# Patient Record
Sex: Female | Born: 1980 | Hispanic: No | Smoking: Never smoker
Health system: Southern US, Community
[De-identification: ages and names within clinical notes are randomized; demographics above are authoritative.]

## PROBLEM LIST (undated history)

## (undated) DIAGNOSIS — J45909 Unspecified asthma, uncomplicated: Secondary | ICD-10-CM

## (undated) DIAGNOSIS — Z9109 Other allergy status, other than to drugs and biological substances: Secondary | ICD-10-CM

## (undated) DIAGNOSIS — Z91018 Allergy to other foods: Secondary | ICD-10-CM

## (undated) HISTORY — DX: Allergy to other foods: Z91.018

## (undated) HISTORY — PX: NASAL TURBINATE REDUCTION: SHX2072

## (undated) HISTORY — DX: Other allergy status, other than to drugs and biological substances: Z91.09

---

## 2006-10-25 ENCOUNTER — Other Ambulatory Visit: Admission: RE | Admit: 2006-10-25 | Discharge: 2006-10-25 | Payer: Self-pay | Admitting: Obstetrics and Gynecology

## 2007-11-06 ENCOUNTER — Other Ambulatory Visit: Admission: RE | Admit: 2007-11-06 | Discharge: 2007-11-06 | Payer: Self-pay | Admitting: Obstetrics and Gynecology

## 2008-11-27 ENCOUNTER — Other Ambulatory Visit: Admission: RE | Admit: 2008-11-27 | Discharge: 2008-11-27 | Payer: Self-pay | Admitting: Obstetrics and Gynecology

## 2010-12-07 ENCOUNTER — Other Ambulatory Visit (HOSPITAL_COMMUNITY)
Admission: RE | Admit: 2010-12-07 | Discharge: 2010-12-07 | Disposition: A | Discharge status: Home / Self Care | Payer: BC Managed Care – PPO | Source: Ambulatory Visit | Attending: Obstetrics and Gynecology | Admitting: Obstetrics and Gynecology

## 2010-12-07 DIAGNOSIS — Z01419 Encounter for gynecological examination (general) (routine) without abnormal findings: Secondary | ICD-10-CM | POA: Insufficient documentation

## 2010-12-07 DIAGNOSIS — Z113 Encounter for screening for infections with a predominantly sexual mode of transmission: Secondary | ICD-10-CM | POA: Insufficient documentation

## 2012-01-24 ENCOUNTER — Other Ambulatory Visit (HOSPITAL_COMMUNITY)
Admission: RE | Admit: 2012-01-24 | Discharge: 2012-01-24 | Disposition: A | Discharge status: Home / Self Care | Payer: BC Managed Care – PPO | Source: Ambulatory Visit | Attending: Obstetrics and Gynecology | Admitting: Obstetrics and Gynecology

## 2012-01-24 DIAGNOSIS — Z01419 Encounter for gynecological examination (general) (routine) without abnormal findings: Secondary | ICD-10-CM | POA: Insufficient documentation

## 2012-01-24 DIAGNOSIS — N760 Acute vaginitis: Secondary | ICD-10-CM | POA: Insufficient documentation

## 2014-01-02 ENCOUNTER — Ambulatory Visit (INDEPENDENT_AMBULATORY_CARE_PROVIDER_SITE_OTHER): Payer: Managed Care, Other (non HMO) | Admitting: Sports Medicine

## 2014-01-02 ENCOUNTER — Encounter: Payer: Self-pay | Admitting: Sports Medicine

## 2014-01-02 VITALS — BP 127/68 | Ht 64.0 in | Wt 145.0 lb

## 2014-01-02 DIAGNOSIS — M76829 Posterior tibial tendinitis, unspecified leg: Secondary | ICD-10-CM | POA: Insufficient documentation

## 2014-01-02 NOTE — Progress Notes (Signed)
Pt presents for 3 years of shin pain. 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 Since then she has had a lot of shin pain when she runs. She has a typical yearly cycle where she takes a break from running during the winter. She is typically pain free for the first 3-4 weeks and then starts having trouble with shin pain. The pain is bilaterally and sharp to throbbing when she overdoes it. It is located in specific areas of the medial shin that change over time. The pain is worst in the 1st mile of a given run and then subsides. She runs about 2x/week about 3-5 miles. She is trying to increase this for a half marathon coming up in mid-October. She has intermittent pain at rest. She also endorses occasional midfoot numbness and toe contractures after running.  She has tried many things over the years including foam rolls, stretches, compression, tape, ice, elevation, PT, shoes, orthotics, changing foot strike, stride length and speed. Nothing has helped significantly.  Examination  On exam she has localized tenderness to deep palpation of the medial shin bilaterally. She has no bony tenderness. Barefoot, she has pronation of the foot R>L which is also evident when running but is mostly corrected with her current orthotics. Heel raise shows norm post tib function Strength is norm Leg length equal Running form shows a neutral midfoot strike except mild pronation on RT even with arch support  On ultrasound there is significant fluid collection around the posterior tibilialis tendons R>L. There is no stress fracture visualized.  No significant periosteal swelling.  Flex Dig adn Flex Hallucis are OK

## 2014-01-02 NOTE — Assessment & Plan Note (Addendum)
Bilateral, chronic,  possible deep posterior compartment syndrome or periostitis but with tendon swelling visualized we will take that as the primary Dx and see if she responds to Tx - gave sports insoles with scaphoid pads - continue compression socks - posterior tib stretching and strengthening exercises - NSAIDs scheduled for 10 days then prn - f/u in 4-6 weeks

## 2014-01-09 ENCOUNTER — Ambulatory Visit: Payer: BC Managed Care – PPO | Admitting: Sports Medicine

## 2014-01-22 ENCOUNTER — Other Ambulatory Visit: Payer: Self-pay | Admitting: *Deleted

## 2014-01-22 DIAGNOSIS — M76829 Posterior tibial tendinitis, unspecified leg: Secondary | ICD-10-CM

## 2014-02-25 ENCOUNTER — Other Ambulatory Visit: Payer: Self-pay | Admitting: Nurse Practitioner

## 2014-02-25 ENCOUNTER — Other Ambulatory Visit (HOSPITAL_COMMUNITY)
Admission: RE | Admit: 2014-02-25 | Discharge: 2014-02-25 | Disposition: A | Discharge status: Home / Self Care | Payer: Managed Care, Other (non HMO) | Source: Ambulatory Visit | Attending: Nurse Practitioner | Admitting: Nurse Practitioner

## 2014-02-25 DIAGNOSIS — Z1151 Encounter for screening for human papillomavirus (HPV): Secondary | ICD-10-CM | POA: Diagnosis present

## 2014-02-25 DIAGNOSIS — Z01419 Encounter for gynecological examination (general) (routine) without abnormal findings: Secondary | ICD-10-CM | POA: Insufficient documentation

## 2014-02-26 LAB — CYTOLOGY - PAP

## 2015-04-26 NOTE — L&D Delivery Note (Addendum)
SVD of viable female @ 0301 APGARS 7-9, NICU called in to attend delivery,  presentation LOA with compound presentation of L arm, over intact perineum with epidural anesthesia.Terminal meconium. Spontaneous delivery of intact placenta with 3VC. Third degree laceration. Dr Alesia Richards called in to do repair. EBL Girl- Dr Margie Billet   I was called for repair of 3rd degree laceration and right labia majora laceration.  I Used 3-0 vicryl for repair of lacerations in usual fashion.  4 cc local aneshesia used over labial tear.  Rectal exam done revealed intact rectal mucosa and posterior vaginal wall.  Postpartum care reviewed with patient.   Dr. Alesia Richards.

## 2015-12-31 ENCOUNTER — Inpatient Hospital Stay (HOSPITAL_COMMUNITY): Payer: Managed Care, Other (non HMO) | Admitting: Anesthesiology

## 2015-12-31 ENCOUNTER — Inpatient Hospital Stay (HOSPITAL_COMMUNITY)
Admission: AD | Admit: 2015-12-31 | Discharge: 2016-01-03 | DRG: 775 | Disposition: A | Discharge status: Home / Self Care | Payer: Managed Care, Other (non HMO) | Source: Ambulatory Visit | Attending: Obstetrics & Gynecology | Admitting: Obstetrics & Gynecology

## 2015-12-31 ENCOUNTER — Encounter (HOSPITAL_COMMUNITY): Payer: Self-pay | Admitting: *Deleted

## 2015-12-31 DIAGNOSIS — O326XX0 Maternal care for compound presentation, not applicable or unspecified: Secondary | ICD-10-CM | POA: Diagnosis present

## 2015-12-31 DIAGNOSIS — Z3A38 38 weeks gestation of pregnancy: Secondary | ICD-10-CM | POA: Diagnosis not present

## 2015-12-31 DIAGNOSIS — J45909 Unspecified asthma, uncomplicated: Secondary | ICD-10-CM | POA: Diagnosis present

## 2015-12-31 DIAGNOSIS — Z3403 Encounter for supervision of normal first pregnancy, third trimester: Secondary | ICD-10-CM | POA: Diagnosis present

## 2015-12-31 DIAGNOSIS — O9952 Diseases of the respiratory system complicating childbirth: Secondary | ICD-10-CM | POA: Diagnosis present

## 2015-12-31 HISTORY — DX: Unspecified asthma, uncomplicated: J45.909

## 2015-12-31 LAB — TYPE AND SCREEN
ABO/RH(D): A POS
ANTIBODY SCREEN: NEGATIVE

## 2015-12-31 LAB — CBC
HCT: 38.6 % (ref 36.0–46.0)
Hemoglobin: 13.7 g/dL (ref 12.0–15.0)
MCH: 33.3 pg (ref 26.0–34.0)
MCHC: 35.5 g/dL (ref 30.0–36.0)
MCV: 93.9 fL (ref 78.0–100.0)
PLATELETS: 223 10*3/uL (ref 150–400)
RBC: 4.11 MIL/uL (ref 3.87–5.11)
RDW: 13.2 % (ref 11.5–15.5)
WBC: 12.7 10*3/uL — AB (ref 4.0–10.5)

## 2015-12-31 LAB — OB RESULTS CONSOLE ANTIBODY SCREEN: Antibody Screen: NEGATIVE

## 2015-12-31 LAB — ABO/RH: ABO/RH(D): A POS

## 2015-12-31 LAB — OB RESULTS CONSOLE RPR: RPR: NONREACTIVE

## 2015-12-31 LAB — OB RESULTS CONSOLE RUBELLA ANTIBODY, IGM: Rubella: IMMUNE

## 2015-12-31 LAB — OB RESULTS CONSOLE GBS: GBS: NEGATIVE

## 2015-12-31 LAB — OB RESULTS CONSOLE ABO/RH: RH TYPE: POSITIVE

## 2015-12-31 LAB — OB RESULTS CONSOLE HEPATITIS B SURFACE ANTIGEN: HEP B S AG: NEGATIVE

## 2015-12-31 LAB — OB RESULTS CONSOLE GC/CHLAMYDIA
Chlamydia: NEGATIVE
Gonorrhea: NEGATIVE

## 2015-12-31 MED ORDER — LIDOCAINE HCL (PF) 1 % IJ SOLN
INTRAMUSCULAR | Status: DC | PRN
  Administered 2015-12-31 (×2): 7 mL via EPIDURAL

## 2015-12-31 MED ORDER — PHENYLEPHRINE 40 MCG/ML (10ML) SYRINGE FOR IV PUSH (FOR BLOOD PRESSURE SUPPORT)
80.0000 ug | PREFILLED_SYRINGE | INTRAVENOUS | Status: DC | PRN
  Filled 2015-12-31: qty 5
  Filled 2015-12-31: qty 10

## 2015-12-31 MED ORDER — PHENYLEPHRINE 40 MCG/ML (10ML) SYRINGE FOR IV PUSH (FOR BLOOD PRESSURE SUPPORT)
80.0000 ug | PREFILLED_SYRINGE | INTRAVENOUS | Status: DC | PRN
  Filled 2015-12-31: qty 5

## 2015-12-31 MED ORDER — ALBUTEROL SULFATE (2.5 MG/3ML) 0.083% IN NEBU: 2.5000 mg | INHALATION_SOLUTION | Freq: Every day | RESPIRATORY_TRACT | Status: DC | PRN

## 2015-12-31 MED ORDER — EPHEDRINE 5 MG/ML INJ
10.0000 mg | INTRAVENOUS | Status: DC | PRN
  Filled 2015-12-31: qty 4

## 2015-12-31 MED ORDER — DIPHENHYDRAMINE HCL 50 MG/ML IJ SOLN: 12.5000 mg | INTRAMUSCULAR | Status: DC | PRN

## 2015-12-31 MED ORDER — ACETAMINOPHEN 325 MG PO TABS
650.0000 mg | ORAL_TABLET | ORAL | Status: DC | PRN
  Filled 2015-12-31 (×3): qty 2

## 2015-12-31 MED ORDER — OXYTOCIN BOLUS FROM INFUSION
500.0000 mL | Freq: Once | INTRAVENOUS | Status: AC
  Administered 2016-01-01: 500 mL via INTRAVENOUS

## 2015-12-31 MED ORDER — BUTORPHANOL TARTRATE 1 MG/ML IJ SOLN: 1.0000 mg | INTRAMUSCULAR | Status: DC | PRN

## 2015-12-31 MED ORDER — ONDANSETRON HCL 4 MG/2ML IJ SOLN: 4.0000 mg | Freq: Four times a day (QID) | INTRAMUSCULAR | Status: DC | PRN

## 2015-12-31 MED ORDER — FENTANYL 2.5 MCG/ML BUPIVACAINE 1/10 % EPIDURAL INFUSION (WH - ANES)
14.0000 mL/h | INTRAMUSCULAR | Status: DC | PRN
  Administered 2015-12-31 – 2016-01-01 (×2): 14 mL/h via EPIDURAL
  Filled 2015-12-31 (×2): qty 125

## 2015-12-31 MED ORDER — SALINE SPRAY 0.65 % NA SOLN
1.0000 | NASAL | Status: DC | PRN
  Filled 2015-12-31: qty 44

## 2015-12-31 MED ORDER — ALBUTEROL SULFATE (2.5 MG/3ML) 0.083% IN NEBU: 3.0000 mL | INHALATION_SOLUTION | Freq: Four times a day (QID) | RESPIRATORY_TRACT | Status: DC | PRN

## 2015-12-31 MED ORDER — OXYTOCIN 40 UNITS IN LACTATED RINGERS INFUSION - SIMPLE MED
2.5000 [IU]/h | INTRAVENOUS | Status: DC
  Filled 2015-12-31: qty 1000

## 2015-12-31 MED ORDER — LACTATED RINGERS IV SOLN
INTRAVENOUS | Status: DC
  Administered 2015-12-31: 20:00:00 via INTRAVENOUS

## 2015-12-31 MED ORDER — LACTATED RINGERS IV SOLN: 500.0000 mL | INTRAVENOUS | Status: DC | PRN

## 2015-12-31 MED ORDER — LACTATED RINGERS IV SOLN: 500.0000 mL | Freq: Once | INTRAVENOUS | Status: DC

## 2015-12-31 MED ORDER — SOD CITRATE-CITRIC ACID 500-334 MG/5ML PO SOLN: 30.0000 mL | ORAL | Status: DC | PRN

## 2015-12-31 MED ORDER — LIDOCAINE HCL (PF) 1 % IJ SOLN
30.0000 mL | INTRAMUSCULAR | Status: DC | PRN
  Filled 2015-12-31: qty 30

## 2015-12-31 NOTE — Progress Notes (Signed)
Kristi Richards is a 35 y.o. G1P0 at 47w0dadmitted for onset of labor  Subjective:  Comfortable with  Epidural Contractions every 2 minutes,   Objective: BP 99/63   Pulse (!) 101   Temp 97.7 F (36.5 C) (Oral)   Resp 18   Ht 5\' 4"  (1.626 m)   Wt 180 lb (81.6 kg)   SpO2 99%   BMI 30.90 kg/m  No intake/output data recorded. No intake/output data recorded.  FHT:  [id number] SVE:   Dilation: 7.5 Effacement (%): 90 Station: -1 Exam by:: [employer] CNM   Labs: Lab Results  Component Value Date   WBC 12.7 (H) 12/31/2015   HGB 13.7 12/31/2015   HCT 38.6 12/31/2015   MCV 93.9 12/31/2015   PLT 223 12/31/2015    Assessment / Plan: Spontaneous labor, progressing normally Fetal Wellbeing: reassuring Anticipated MOD:  NSVD  Lori A Clemmons 12/31/2015, 8:27 PM

## 2015-12-31 NOTE — Anesthesia Preprocedure Evaluation (Signed)
Anesthesia Evaluation  Patient identified by MRN, date of birth, ID band Patient awake    Reviewed: Allergy & Precautions, H&P , NPO status , Patient's Chart, lab work & pertinent test results  Airway Mallampati: I  TM Distance: >3 FB Neck ROM: full    Dental no notable dental hx.    Pulmonary    Pulmonary exam normal        Cardiovascular negative cardio ROS Normal cardiovascular exam     Neuro/Psych negative neurological ROS  negative psych ROS   GI/Hepatic negative GI ROS, Neg liver ROS,   Endo/Other  negative endocrine ROS  Renal/GU negative Renal ROS     Musculoskeletal   Abdominal (+) + obese,   Peds  Hematology negative hematology ROS (+)   Anesthesia Other Findings   Reproductive/Obstetrics (+) Pregnancy                             Anesthesia Physical Anesthesia Plan  ASA: II  Anesthesia Plan: Epidural   Post-op Pain Management:    Induction:   Airway Management Planned:   Additional Equipment:   Intra-op Plan:   Post-operative Plan:   Informed Consent: I have reviewed the patients History and Physical, chart, labs and discussed the procedure including the risks, benefits and alternatives for the proposed anesthesia with the patient or authorized representative who has indicated his/her understanding and acceptance.     Plan Discussed with:   Anesthesia Plan Comments:         Anesthesia Quick Evaluation

## 2015-12-31 NOTE — MAU Note (Signed)
Notified provider that patient is 5.5/80/-2. Provider said she would puty in admit orders.

## 2015-12-31 NOTE — H&P (Signed)
Kristi Richards is a 35 y.o. female, G1P0 at [redacted]w[redacted]d, presenting for painful contractions every 2-63min.  That starting around noon and got progressively closer together and stronger.  Now, resting comfortably with epidural  Preg c/b:  1) Asthma- albuterol prn, Symbicort usually once per week- not seeing anyone for her asthma 2) RUQ pain in first trimester, possible hemangioma on [place], plan for repeat postpartum.  Currently asymptomatic   OB History    Gravida Para Term Preterm AB Living   1             SAB TAB Ectopic Multiple Live Births                 No past medical history on file. No past surgical history on file. Family History: family history is not on file. Social History:  reports that she has never smoked. She does not have any smokeless tobacco history on file. Her alcohol and drug histories are not on file.   Prenatal Transfer Tool  Maternal Diabetes: No Genetic Screening: Normal Maternal Ultrasounds/Referrals: Normal Fetal Ultrasounds or other Referrals:  None Maternal Substance Abuse:  No Significant Maternal Medications:  Meds include: Other: Proair, symbicort Significant Maternal Lab Results: Lab values include: Group B Strep negative  ROS:  No headache, no blurry vision, no RUQ apin  Allergies  Allergen Reactions  . Codeine     Gen: NAD Chest clear Heart RRR Abd gravid, NT Ext: minimal edema, no calf tenderness bilaterally  FHT: 145, moderate variability, no accels, no decels Toco: q 2-23min. SVE: AROM clear, 7/90/-1  Prenatal labs: ABO, Rh: A positive   Antibody:  negative Rubella:  immune RPR:   NR HBsAg:   neg HIV:   neg GBS:  neg Genetic screenings:  negative Glucola:  107 Hgb: 11.9       Assessment/Plan: 35yo G1P0@ [redacted]w[redacted]d in active labor -FWB: Cat. I -Labor: expectant management -Pain: continue epidural -Asthma: nebulizer prn  CCOB to cover from 7p-7am  Janyth Pupa, DO [phone] (pager) [phone] (office)

## 2015-12-31 NOTE — Anesthesia Procedure Notes (Signed)
Epidural Patient location during procedure: OB Start time: 12/31/2015 5:55 PM End time: 12/31/2015 5:59 PM  Staffing Anesthesiologist: Lyn Hollingshead Performed: anesthesiologist   Preanesthetic Checklist Completed: patient identified, surgical consent, pre-op evaluation, timeout performed, IV checked, risks and benefits discussed and monitors and equipment checked  Epidural Patient position: sitting Prep: site prepped and draped and DuraPrep Patient monitoring: continuous pulse ox and blood pressure Approach: midline Location: L3-L4 Injection technique: LOR air  Needle:  Needle type: Tuohy  Needle gauge: 17 G Needle length: 9 cm and 9 Needle insertion depth: 5 cm cm Catheter type: closed end flexible Catheter size: 19 Gauge Catheter at skin depth: 10 cm Test dose: negative and Other  Assessment Sensory level: T9 Events: blood not aspirated, injection not painful, no injection resistance, negative IV test and no paresthesia  Additional Notes Reason for block:procedure for pain

## 2015-12-31 NOTE — MAU Note (Signed)
Pt reports having strong ctx off and on  All afternoon. Denies any vag bleeding or leaking. Good fetal movement reported

## 2015-12-31 NOTE — Anesthesia Pain Management Evaluation Note (Signed)
  CRNA Pain Management Visit Note  Patient: Kristi Richards, 36 y.o., female  "Hello I am a member of the anesthesia team at [facility]. We have an anesthesia team available at all times to provide care throughout the hospital, including epidural management and anesthesia for C-section. I don't know your plan for the delivery whether it a natural birth, water birth, IV sedation, nitrous supplementation, doula or epidural, but we want to meet your pain goals."   1.Was your pain managed to your expectations on prior hospitalizations?   No prior hospitalizationsNo prior hospitalizations  2.What is your expectation for pain management during this hospitalization?     Epidural  3.How can we help you reach that goal? Epidural has been places and is working well.  Record the patient's initial score and the patient's pain goal.   Pain: 2  Pain Goal: 4 [facility] wants you to be able to say your pain was always managed very well.  Kristi Richards 12/31/2015

## 2016-01-01 ENCOUNTER — Encounter (HOSPITAL_COMMUNITY): Payer: Self-pay | Admitting: *Deleted

## 2016-01-01 LAB — RPR: RPR: NONREACTIVE

## 2016-01-01 MED ORDER — ONDANSETRON HCL 4 MG PO TABS
4.0000 mg | ORAL_TABLET | ORAL | Status: DC | PRN
Start: 2016-01-01 — End: 2016-01-03

## 2016-01-01 MED ORDER — ACETAMINOPHEN 325 MG PO TABS
650.0000 mg | ORAL_TABLET | ORAL | Status: DC | PRN
Start: 2016-01-01 — End: 2016-01-03
  Administered 2016-01-01 – 2016-01-02 (×3): 650 mg via ORAL
  Filled 2016-01-01: qty 2

## 2016-01-01 MED ORDER — SENNOSIDES-DOCUSATE SODIUM 8.6-50 MG PO TABS
2.0000 | ORAL_TABLET | ORAL | Status: DC
  Administered 2016-01-02 – 2016-01-03 (×3): 2 via ORAL
  Filled 2016-01-01 (×3): qty 2

## 2016-01-01 MED ORDER — ZOLPIDEM TARTRATE 5 MG PO TABS: 5.0000 mg | ORAL_TABLET | Freq: Every evening | ORAL | Status: DC | PRN

## 2016-01-01 MED ORDER — PRENATAL MULTIVITAMIN CH
1.0000 | ORAL_TABLET | Freq: Every day | ORAL | Status: DC
  Administered 2016-01-01 – 2016-01-03 (×3): 1 via ORAL
  Filled 2016-01-01 (×3): qty 1

## 2016-01-01 MED ORDER — TETANUS-DIPHTH-ACELL PERTUSSIS 5-2.5-18.5 LF-MCG/0.5 IM SUSP: 0.5000 mL | Freq: Once | INTRAMUSCULAR | Status: DC

## 2016-01-01 MED ORDER — DIBUCAINE 1 % RE OINT
1.0000 | TOPICAL_OINTMENT | RECTAL | Status: DC | PRN
Start: 2016-01-01 — End: 2016-01-03

## 2016-01-01 MED ORDER — BENZOCAINE-MENTHOL 20-0.5 % EX AERO
1.0000 "application " | INHALATION_SPRAY | CUTANEOUS | Status: DC | PRN
  Administered 2016-01-02: 1 via TOPICAL
  Filled 2016-01-01: qty 56

## 2016-01-01 MED ORDER — SIMETHICONE 80 MG PO CHEW: 80.0000 mg | CHEWABLE_TABLET | ORAL | Status: DC | PRN

## 2016-01-01 MED ORDER — WITCH HAZEL-GLYCERIN EX PADS: 1.0000 "application " | MEDICATED_PAD | CUTANEOUS | Status: DC | PRN

## 2016-01-01 MED ORDER — ONDANSETRON HCL 4 MG/2ML IJ SOLN: 4.0000 mg | INTRAMUSCULAR | Status: DC | PRN

## 2016-01-01 MED ORDER — COCONUT OIL OIL: 1.0000 "application " | TOPICAL_OIL | Status: DC | PRN

## 2016-01-01 MED ORDER — DIPHENHYDRAMINE HCL 25 MG PO CAPS: 25.0000 mg | ORAL_CAPSULE | Freq: Four times a day (QID) | ORAL | Status: DC | PRN

## 2016-01-01 MED ORDER — IBUPROFEN 600 MG PO TABS
600.0000 mg | ORAL_TABLET | Freq: Four times a day (QID) | ORAL | Status: DC
  Administered 2016-01-01 – 2016-01-03 (×6): 600 mg via ORAL
  Filled 2016-01-01 (×11): qty 1

## 2016-01-01 NOTE — Anesthesia Postprocedure Evaluation (Signed)
Anesthesia Post Note  Patient: Kristi Richards  Procedure(s) Performed: * No procedures listed *  Patient location during evaluation: Mother Baby Anesthesia Type: Epidural Level of consciousness: awake Pain management: pain level controlled Vital Signs Assessment: post-procedure vital signs reviewed and stable Respiratory status: spontaneous breathing Cardiovascular status: stable Postop Assessment: no headache, no backache, epidural receding and patient able to bend at knees Anesthetic complications: no     Last Vitals:  Vitals:   01/01/16 0559 01/01/16 0701  BP: 117/64 122/61  Pulse: 68 71  Resp: 20   Temp: 36.7 C 36.8 C    Last Pain:  Vitals:   01/01/16 0559  TempSrc: Oral  PainSc: 0-No pain   Pain Goal:                 [name]

## 2016-01-01 NOTE — Lactation Note (Signed)
This note was copied from a baby's chart. Lactation Consultation Note  P1, Baby 36 hours old.  Reviewed hand expression.  Easily expressed flow. Evert, compressible nipples. Assisted w/ latching in cradle hold.  Intermittent sucks and swallows observed w/ compression Sandria Senter. Reviewed basics and Baby and Me booklet.  Mom encouraged to feed baby 8-12 times/24 hours and with feeding cues.  Mom made aware of O/P services, breastfeeding support groups, community resources, and our phone # for post-discharge questions.    Patient Name: Kristi Richards [id number] Date: 01/01/2016 Reason for consult: Initial assessment   Maternal Data Has patient been taught Hand Expression?: Yes Does the patient have breastfeeding experience prior to this delivery?: No  Feeding Feeding Type: Breast Fed  LATCH Score/Interventions Latch: Grasps breast easily, tongue down, lips flanged, rhythmical sucking. Intervention(s): Adjust position;Assist with latch;Breast massage  Audible Swallowing: A few with stimulation  Type of Nipple: Everted at rest and after stimulation  Comfort (Breast/Nipple): Soft / non-tender     Hold (Positioning): Assistance needed to correctly position infant at breast and maintain latch.  LATCH Score: 8  Lactation Tools Discussed/Used     Consult Status Consult Status: Follow-up Date: 01/02/16 Follow-up type: In-patient    Vivianne Master [facility] 01/01/2016, 2:53 PM

## 2016-01-01 NOTE — Progress Notes (Signed)
Postpartum day #0, NSVD, 3rd degree laceration  Subjective Pt without complaints.  Lochia normal.  Pain controlled.  Breast feeding yes  Temp:  [97.7 F (36.5 C)-99.1 F (37.3 C)] 97.9 F (36.6 C) (09/08 1215) Pulse Rate:  [62-119] 62 (09/08 1215) Resp:  [16-20] 20 (09/08 1215) BP: (99-170)/(50-123) 119/55 (09/08 1215) SpO2:  [99 %-100 %] 99 % (09/07 2058) Weight:  [81.6 kg (180 lb)] 81.6 kg (180 lb) (09/07 1721)  Gen:  NAD, A&O x 3 Uterine fundus:  Firm, nontender Lochia normal   CBC    Component Value Date/Time   WBC 12.7 (H) 12/31/2015 1658   RBC 4.11 12/31/2015 1658   HGB 13.7 12/31/2015 1658   HCT 38.6 12/31/2015 1658   PLT 223 12/31/2015 1658   MCV 93.9 12/31/2015 1658   MCH 33.3 12/31/2015 1658   MCHC 35.5 12/31/2015 1658   RDW 13.2 12/31/2015 1658     A/P: S/p SVD doing well. Routine postpartum care. Lactation support. 3rd degree laceration.  F/u 2 weeks postpartum. [facility] to assume care at 7 pm and the remainder of the weekend.  Pt informed.  Kristi Richards 01/01/2016, 1:17 PM

## 2016-01-02 LAB — CBC
HCT: 33.4 % — ABNORMAL LOW (ref 36.0–46.0)
Hemoglobin: 11.5 g/dL — ABNORMAL LOW (ref 12.0–15.0)
MCH: 33 pg (ref 26.0–34.0)
MCHC: 34.4 g/dL (ref 30.0–36.0)
MCV: 95.7 fL (ref 78.0–100.0)
Platelets: 192 K/uL (ref 150–400)
RBC: 3.49 MIL/uL — ABNORMAL LOW (ref 3.87–5.11)
RDW: 13.8 % (ref 11.5–15.5)
WBC: 12.9 K/uL — ABNORMAL HIGH (ref 4.0–10.5)

## 2016-01-02 NOTE — Discharge Instructions (Signed)

## 2016-01-02 NOTE — Discharge Summary (Signed)
[facility] Discharge Summary   Patient Name:   Kristi Richards DOB:     [birth year] MRN:     [id number]  Date of Admission:   12/31/2015 Date of Discharge:  01/03/2016  Admitting diagnosis:    38WKS LABOR EVAL Principal Problem:   Vaginal delivery Active Problems:   Third degree perineal laceration  3rd degree laceration    Discharge diagnosis:    38WKS LABOR EVAL Principal Problem:   Vaginal delivery Active Problems:   Third degree perineal laceration  3rd degree laceration                                                                Post partum procedures: NA  Type of Delivery:  SVB   Delivering Provider: Larey Days  for [facility], 3rd degree repair by Dr. Alesia Richards  Date of Delivery:  01/01/16  Newborn Data:    Live born female  Birth Weight: 6 lb 14.8 oz (3140 g) APGAR: 7, 9    Baby Feeding:   Breast Disposition:   Home with mother  Complications:   None  Hospital course:      Onset of Labor With Vaginal Delivery     35 y.o. yo G1P1001 at [redacted]w[redacted]d was admitted in Active Labor on 12/31/2015. Patient had an uncomplicated labor course as follows:  Membrane Rupture Time/Date: 6:57 PM ,12/31/2015   Intrapartum Procedures: Episiotomy: None [1]                                         Lacerations:  3rd degree [4];Labial [10]  Patient had a delivery of a Viable infant. 01/01/2016  Information for the patient's newborn:  Tyquasha, Sudar [id number]  Delivery Method: Vag-Spont    Pateint had an uncomplicated postpartum course.  She is ambulating, tolerating a regular diet, passing flatus, and urinating well. Patient is discharged home in stable condition on 01/03/16. She is to f/u in 2 weeks at [facility] for recheck of perineal laceration healing.    Physical Exam:   Vitals:   01/01/16 1215 01/01/16 1730 01/01/16 2105 01/02/16 0535  BP: (!) 119/55 (!) 109/57 115/63 116/64  Pulse: 62 67 72 60  Resp: 20 18  18   Temp: 97.9 F (36.6 C) 98.3 F (36.8 C) 98 F (36.7  C) 98.1 F (36.7 C)  TempSrc: Oral Oral Oral Oral  SpO2:   98%   Weight:      Height:       General: alert Lochia: appropriate Uterine Fundus: firm Incision: Healing well with no significant drainage DVT Evaluation: No evidence of DVT seen on physical exam. Negative Homan's sign.  Labs:  CBC Latest Ref Rng & Units 01/02/2016 12/31/2015  WBC 4.0 - 10.5 K/uL 12.9(H) 12.7(H)  Hemoglobin 12.0 - 15.0 g/dL 11.5(L) 13.7  Hematocrit 36.0 - 46.0 % 33.4(L) 38.6  Platelets 150 - 400 K/uL 192 223     Discharge instruction: per After Visit Summary and "Baby and Me Booklet".  After Visit Meds:    Medication List    TAKE these medications   budesonide-formoterol 160-4.5 MCG/ACT inhaler Commonly known as:  SYMBICORT Inhale 1 puff into the lungs 2 (two) times  daily.   calcium carbonate 500 MG chewable tablet Commonly known as:  TUMS - dosed in mg elemental calcium Chew 1 tablet by mouth daily.   cetirizine 10 MG tablet Commonly known as:  ZYRTEC Take 10 mg by mouth daily.   ibuprofen 600 MG tablet Commonly known as:  ADVIL,MOTRIN Take 1 tablet (600 mg total) by mouth every 6 (six) hours as needed.   montelukast 10 MG tablet Commonly known as:  SINGULAIR   prenatal multivitamin Tabs tablet Take 1 tablet by mouth daily at 12 noon.   PROAIR HFA 108 (90 Base) MCG/ACT inhaler Generic drug:  albuterol       Diet: routine diet  Activity: Advance as tolerated. Pelvic rest for 6 weeks.   Outpatient follow up:2 weeks with Dr. Simona Huh for laceration evaluation Follow up Appt:No future appointments. Follow up visit: No Follow-up on file.  Postpartum contraception: Undecided  01/03/2016 Donnel Saxon, CNM

## 2016-01-02 NOTE — Lactation Note (Signed)
This note was copied from a baby's chart. Lactation Consultation Note  Patient Name: Kristi Richards [id number] Date: 01/02/2016 Reason for consult: Follow-up assessment  Baby 75 hours old. Mom hold baby against her chest, baby fussy and arching her back while crying. Mom reports that she thinks the baby is gassy. Offered to assist with latching baby at breast, but mom declined stating that she thinks the baby is nursing well. Enc mom to call for assistance with latching as needed.   Maternal Data    Feeding Feeding Type: Breast Fed Length of feed: 20 min  LATCH Score/Interventions                      Lactation Tools Discussed/Used     Consult Status Consult Status: Follow-up Date: 01/03/16 Follow-up type: In-patient    Andres Labrum 01/02/2016, 5:58 PM

## 2016-01-02 NOTE — Progress Notes (Signed)
Subjective: Postpartum Day 1: Vaginal delivery, 3rd degree laceration Patient up ad lib, reports no syncope or dizziness. Feeding:  Breast Contraceptive plan:  Undecided  Objective: Vital signs in last 24 hours: Temp:  [97.9 F (36.6 C)-98.3 F (36.8 C)] 98 F (36.7 C) (09/08 2105) Pulse Rate:  [62-72] 72 (09/08 2105) Resp:  [18-20] 18 (09/08 1730) BP: (109-122)/(55-64) 115/63 (09/08 2105) SpO2:  [98 %] 98 % (09/08 2105)  Physical Exam:  General: alert Lochia: appropriate Uterine Fundus: firm Perineum: healing well DVT Evaluation: No evidence of DVT seen on physical exam. Negative Homan's sign.   CBC Latest Ref Rng & Units 12/31/2015  WBC 4.0 - 10.5 K/uL 12.7(H)  Hemoglobin 12.0 - 15.0 g/dL 13.7  Hematocrit 36.0 - 46.0 % 38.6  Platelets 150 - 400 K/uL 223     Assessment/Plan: Status post vaginal delivery day 1. Stable Continue current care. Day 1 CBC pending Plan for discharge tomorrow    Allena Katz 01/02/2016, 5:30 AM

## 2016-01-03 MED ORDER — IBUPROFEN 600 MG PO TABS: 600.0000 mg | ORAL_TABLET | Freq: Four times a day (QID) | ORAL | 2 refills | Status: AC | PRN

## 2016-01-03 NOTE — Lactation Note (Signed)
This note was copied from a baby's chart. Lactation Consultation Note  Baby latched on L breast upon entering. Swallows observed with stimulation.  Baby sleepy. Suggest mother burp and switch baby to R breast.  Sucks and swallows observed. Reminded mother to compress breast during feedings to keep baby active. Recommend mother post pump w/ manual pump after feedings and give back to baby on spoon until voids/stools increase. Reviewed engorgement care and monitoring voids/stools. Mom encouraged to feed baby 8-12 times/24 hours and with feeding cues.    Patient Name: Kristi Richards [id number] Date: 01/03/2016 Reason for consult: Follow-up assessment   Maternal Data    Feeding Feeding Type: Breast Fed Length of feed: 20 min  LATCH Score/Interventions Latch: Grasps breast easily, tongue down, lips flanged, rhythmical sucking.  Audible Swallowing: A few with stimulation Intervention(s): Hand expression;Alternate breast massage;Skin to skin  Type of Nipple: Everted at rest and after stimulation  Comfort (Breast/Nipple): Soft / non-tender  Problem noted: Mild/Moderate discomfort Interventions (Mild/moderate discomfort): Hand expression  Hold (Positioning): No assistance needed to correctly position infant at breast.  LATCH Score: 9  Lactation Tools Discussed/Used     Consult Status Consult Status: Complete    Carlye Grippe 01/03/2016, 9:59 AM

## 2017-02-17 ENCOUNTER — Ambulatory Visit (INDEPENDENT_AMBULATORY_CARE_PROVIDER_SITE_OTHER): Payer: Managed Care, Other (non HMO) | Admitting: Allergy

## 2017-02-17 ENCOUNTER — Encounter: Payer: Self-pay | Admitting: Allergy

## 2017-02-17 VITALS — BP 102/60 | HR 90 | Temp 98.3°F | Resp 16 | Ht 64.5 in | Wt 157.0 lb

## 2017-02-17 DIAGNOSIS — J4530 Mild persistent asthma, uncomplicated: Secondary | ICD-10-CM | POA: Diagnosis not present

## 2017-02-17 DIAGNOSIS — T781XXD Other adverse food reactions, not elsewhere classified, subsequent encounter: Secondary | ICD-10-CM

## 2017-02-17 DIAGNOSIS — J309 Allergic rhinitis, unspecified: Secondary | ICD-10-CM

## 2017-02-17 DIAGNOSIS — Z91018 Allergy to other foods: Secondary | ICD-10-CM

## 2017-02-17 DIAGNOSIS — H1010 Acute atopic conjunctivitis, unspecified eye: Secondary | ICD-10-CM | POA: Diagnosis not present

## 2017-02-17 MED ORDER — EPINEPHRINE 0.3 MG/0.3ML IJ SOAJ: 0.3000 mg | Freq: Once | INTRAMUSCULAR | 1 refills | Status: AC

## 2017-02-17 MED ORDER — BECLOMETHASONE DIPROP HFA 80 MCG/ACT IN AERB: 2.0000 | INHALATION_SPRAY | Freq: Two times a day (BID) | RESPIRATORY_TRACT | 5 refills | Status: AC

## 2017-02-17 MED ORDER — ALBUTEROL SULFATE HFA 108 (90 BASE) MCG/ACT IN AERS: INHALATION_SPRAY | RESPIRATORY_TRACT | 1 refills | Status: AC

## 2017-02-17 NOTE — Progress Notes (Signed)
New Patient Note  RE: Kristi Richards MRN: [id number] DOB: 11/05/1980 Date of Office Visit: 02/17/2017  Referring provider: Kelton Pillar, MD Primary care provider: Kelton Pillar, MD  Chief Complaint: asthma, allergies, food allergy  History of present illness: Kristi Richards is a 36 y.o. female presenting today for consultation for asthma, allergies, pollen food allergy syndrome.   She was previously seen by Dr. Neldon Mc however her last visit was in 2012.    She had a baby last year and after this she has noticed that foods she was sensitive too has worsen and she feels she has developed new food sensitivities.  She states she has PFAS and reports being allergic to some raw fruits (mostly stone fruits) and raw vegeatbles.  She avoids bell peppers (mostly with green and red) as she developed itchy tongue, itchy throat and inner ear itch and will usually need to use her inhaler (not because she is having difficulty breathing but do to throat discomfort).  She had reaction to bell pepper this year in [place] that was made in a stew.  With stone fruits (peaches, cherries) as well as bananas she developed similar symptoms to bell pepper.  She can eat cooked and frozen forms of the fruits.  Also avoids raw carrots.  She made roasted carrots for her child this weekend however she ate a piece of raw carrot and similar reaction as to bell peppers.  She has noticed reaction to beers.  She reports it doesn't happen with all beers but mostly with IPAs.  She felt she like her throat was tightening.  She reports doing fine with darker beers.  She had a raw veggie salad with squash, zucchini, tomato, red onion and reports similar symptoms to bell pepper.     She is allergic eggs and she reports she vomits with ingestion.  She is able to eat baked egg options.  Her daughter has also been diagnosed with an egg allergy.   She has history of asthma diagnosed in childhood.  She feels her triggers are  mostly allergies.  She uses albuterol about twice week.  She reports symptoms of SOB with good relief of symptoms.  No nighttime awakenings.  She denies remembering any exacerbations requiring Ed/UC or oral steroids.  She is on symbicort 1 puffs a day and she reports using it about once a week.  She still takes singulair daily.    She has allergic rhinoconjunctivitis with symptoms of itchy eyes, nasal congestion.  She does not remember what she was allergic test on previous testing. She reports using saline spray occasionally.  She has used flonase in the past.  She has alrex eyedrops that she uses 2 times a week under direction of her eye doctor.  She also has Alaway that she uses as needed.  She reports she get very itchy and develop hives with contact exposure to certain dogs but not to her own.      Review of systems: Review of Systems  Constitutional: Negative for chills, fever and malaise/fatigue.  HENT: Positive for congestion. Negative for ear discharge, ear pain, nosebleeds, sinus pain, sore throat and tinnitus.   Eyes: Negative for discharge and redness.  Respiratory: Positive for shortness of breath. Negative for cough and wheezing.   Cardiovascular: Negative for chest pain.  Gastrointestinal: Negative for abdominal pain, constipation, diarrhea, heartburn, nausea and vomiting.  Musculoskeletal: Negative for joint pain and myalgias.  Skin: Negative for itching and rash.  Neurological: Negative for headaches.  All other systems negative unless noted above in HPI  Past medical history: Past Medical History:  Diagnosis Date  . Asthma    using inhaler daily   . Environmental allergies   . Food allergy     Past surgical history: Past Surgical History:  Procedure Laterality Date  . NASAL TURBINATE REDUCTION      Family history:  Family History  Problem Relation Age of Onset  . Food Allergy Daughter     Social history: She lives in a townhome without carpeting with  electric heating and central cooling.  There are 2 dogs in the home.  There is no concern for water damage, mildew or roaches in the home.  She works as a Facilities manager. She has no smoking history.     Medication List: Allergies as of 02/17/2017      Reactions   Codeine Nausea And Vomiting      Medication List       Accurate as of 02/17/17  9:51 PM. Always use your most recent med list.          albuterol 108 (90 Base) MCG/ACT inhaler Commonly known as:  PROAIR HFA 2 puff by inhalation every 4-6 hours as needed.   ALREX 0.2 % Susp Generic drug:  loteprednol   beclomethasone 80 MCG/ACT inhaler Commonly known as:  QVAR REDIHALER Inhale 2 puffs into the lungs 2 (two) times daily.   budesonide-formoterol 160-4.5 MCG/ACT inhaler Commonly known as:  SYMBICORT Inhale 1 puff into the lungs 2 (two) times daily.   calcium carbonate 500 MG chewable tablet Commonly known as:  TUMS - dosed in mg elemental calcium Chew 1 tablet by mouth daily.   CAMILA 0.35 MG tablet Generic drug:  norethindrone Take 1 tablet by mouth daily.   cetirizine 10 MG tablet Commonly known as:  ZYRTEC Take 10 mg by mouth daily.   EPINEPHrine 0.3 mg/0.3 mL Soaj injection Commonly known as:  AUVI-Q Inject 0.3 mLs (0.3 mg total) into the muscle once.   ibuprofen 600 MG tablet Commonly known as:  ADVIL,MOTRIN Take 1 tablet (600 mg total) by mouth every 6 (six) hours as needed.   montelukast 10 MG tablet Commonly known as:  SINGULAIR       Known medication allergies: Allergies  Allergen Reactions  . Codeine Nausea And Vomiting     Physical examination: Blood pressure 102/60, pulse 90, temperature 98.3 F (36.8 C), temperature source Oral, resp. rate 16, height 5' 4.5" (1.638 m), weight 157 lb (71.2 kg), SpO2 98 %, unknown if currently breastfeeding.  General: Alert, interactive, in no acute distress. HEENT: TMs pearly gray, turbinates mildly edematous with clear discharge, post-pharynx  non erythematous. Neck: Supple without lymphadenopathy. Lungs: Clear to auscultation without wheezing, rhonchi or rales. {no increased work of breathing. CV: Normal S1, S2 without murmurs. Abdomen: Nondistended, nontender. Skin: Warm and dry, without lesions or rashes. Extremities:  No clubbing, cyanosis or edema. Neuro:   Grossly intact.  Diagnositics/Labs: Spirometry: FEV1: 2.68L  83%, FVC: 3.75L  97%, ratio consistent with nonobstructive pattern  Assessment and plan:   Asthma, mild persistent    - at this time control is good    - stop symbicort and will step-down therapy to Qvar 2 puffs twice a day    - continue singulair 10mg  daily - take at bedtime    - have access to albuterol inhaler 2 puffs every 4-6 hours as needed for cough/wheeze/shortness of breath/chest tightness.  May use 15-20 minutes prior  to activity.   Monitor frequency of use.    Asthma control goals:   Full participation in all desired activities (may need albuterol before activity)  Albuterol use two time or less a week on average (not counting use with activity)  Cough interfering with sleep two time or less a month  Oral steroids no more than once a year  No hospitalizations  Allergic rhinoconjunctivitis    - will obtain environmental panel   - continue Zyrtec 10mg  daily   - use Flonase 1-2 sprays each nostril as needed for nasal congestion/drainage.  It is most effective if you use daily for 1-2 weeks at a time before stopping once symptoms improve   - singulair as above   - use your alrex as directed by your eye doctor   - use Alaway 1 drop each eye as needed for itchy/watery/red eyes  Food allergy    - continue avoidance of stove-top eggs    - will obtain egg IgE     - will prescribe you an AuviQ to have access to incase of allergic reaction.  Follow emergency action plan.    Pollen Food Allergy syndrome    - continue avoidance of current foods that cause oral symptoms including bell pepper  (green/red), stone fruits, carrots, banana, squash/zucchini, tomato, onion at this time.   Also would avoid IPA beers as much as possible.  Will obtain serum IgE to these foods to ensure you have not developed any sensitizations to these    - PFAS very rarely progresses outside of the oral cavity  She is a [employer] employee and request labs instead of prick testing today  Follow-up 4-6 months or sooner if needed  I appreciate the opportunity to take part in Sheldon's care. Please do not hesitate to contact me with questions.  Sincerely,   Prudy Feeler, MD Allergy/Immunology [facility] [place] [facility] [place]

## 2017-02-17 NOTE — Patient Instructions (Addendum)
Asthma    - at this time control is good    - stop symbicort and will step-down therapy to Qvar 2 puffs twice a day    - continue singulair 10mg  daily - take at bedtime    - have access to albuterol inhaler 2 puffs every 4-6 hours as needed for cough/wheeze/shortness of breath/chest tightness.  May use 15-20 minutes prior to activity.   Monitor frequency of use.    Asthma control goals:   Full participation in all desired activities (may need albuterol before activity)  Albuterol use two time or less a week on average (not counting use with activity)  Cough interfering with sleep two time or less a month  Oral steroids no more than once a year  No hospitalizations  Allergic rhinoconjunctivitis    - will obtain environmental panel   - continue Zyrtec 10mg  daily   - use Flonase 1-2 sprays each nostril as needed for nasal congestion/drainage.  It is most effective if you use daily for 1-2 weeks at a time before stopping once symptoms improve   - singulair as above   - use your alrex as directed by your eye doctor   - use Alaway 1 drop each eye as needed for itchy/watery/red eyes  Food allergy    - continue avoidance of stove-top eggs    - will obtain egg IgE     - will prescribe you an AuviQ to have access to incase of allergic reaction.  Follow emergency action plan.    Pollen Food Allergy syndrome    - continue avoidance of current foods that cause oral symptoms including bell pepper (green/red), stone fruits, carrots, banana, squash/zucchini, tomato, onion at this time.   Also would avoid IPA beers as much as possible.  Will obtain serum IgE to these foods to ensure you have not developed any sensitizations to these    - PFAS very rarely progresses outside of the oral cavity   Follow-up 4-6 months or sooner if needed

## 2017-02-20 LAB — IGE+ALLERGENS ZONE 2(30)
Amer Sycamore IgE Qn: 1.6 kU/L — AB
Aspergillus Fumigatus IgE: 0.52 kU/L — AB
Bahia Grass IgE: 4.13 kU/L — AB
Cat Dander IgE: 0.64 kU/L — AB
Cladosporium Herbarum IgE: 0.18 kU/L — AB
Cockroach, American IgE: 0.2 kU/L — AB
D Farinae IgE: 1.75 kU/L — AB
D Pteronyssinus IgE: 1.78 kU/L — AB
E005-IGE DOG DANDER: 3.22 kU/L — AB
Elm, American IgE: 2.23 kU/L — AB
G002-IGE BERMUDA GRASS: 1.92 kU/L — AB
G010-IGE JOHNSON GRASS: 2.54 kU/L — AB
Hickory, White IgE: 2.44 kU/L — AB
IgE (Immunoglobulin E), Serum: 148 IU/mL — ABNORMAL HIGH (ref 0–100)
M004-IGE MUCOR RACEMOSUS: 0.54 kU/L — AB
M006-IGE ALTERNARIA ALTERNATA: 0.37 kU/L — AB
M010-IGE STEMPHYLIUM HERBARUM: 0.62 kU/L — AB
MUGWORT IGE QN: 1.08 kU/L — AB
Maple/Box Elder IgE: 1.76 kU/L — AB
Nettle IgE: 0.79 kU/L — AB
Penicillium Chrysogen IgE: 0.17 kU/L — AB
Pigweed, Rough IgE: 1.13 kU/L — AB
Ragweed, Short IgE: 4.13 kU/L — AB
SWEET GUM IGE RAST QL: 1.35 kU/L — AB
Sheep Sorrel IgE Qn: 1.37 kU/L — AB
T003-IGE COMMON SILVER BIRCH: 5.3 kU/L — AB
T006-IGE CEDAR, MOUNTAIN: 0.9 kU/L — AB
T007-IGE OAK, WHITE: 5.46 kU/L — AB
Timothy Grass IgE: 5.44 kU/L — AB
W009-IGE PLANTAIN, ENGLISH: 1.77 kU/L — AB
White Mulberry IgE: 0.64 kU/L — AB

## 2017-02-20 LAB — ALLERGEN PEACH F95: ALLERGEN PEACH F95: 1.15 kU/L — AB

## 2017-02-20 LAB — ALLERGEN PROFILE, VEGETABLE II
ALLERGEN TOMATO, IGE: 1.12 kU/L — AB
Allergen Carrot IgE: 1.32 kU/L — AB
Allergen Green Pea IgE: 0.57 kU/L — AB
Allergen Onion IgE: 0.92 kU/L — AB
Allergen Potato, White IgE: 1.26 kU/L — AB
F315-IGE GREEN BEAN: 1.03 kU/L — AB
Kidney Bean IgE: 0.96 kU/L — AB
Pumpkin IgE: 1.02 kU/L — AB
SOYBEAN IGE: 0.95 kU/L — AB

## 2017-02-20 LAB — F263-IGE GREEN BELL PEPPER: Allergen Green Bell Pepper IgE: <0.1 kU/L

## 2017-02-20 LAB — ALLERGEN BANANA: Allergen Banana IgE: 0.75 kU/L — AB

## 2017-02-20 LAB — ALLERGEN, HOPS/FRUIT CONE, IGE: Hops: 0.27 kU/L — AB

## 2017-02-20 LAB — F245-IGE EGG, WHOLE: EGG, WHOLE IGE: 0.35 kU/L — AB

## 2017-02-20 LAB — ALLERGEN, CHERRY, F242: F242-IgE Bing Cherry: 1.09 kU/L — AB

## 2017-02-22 ENCOUNTER — Telehealth: Payer: Self-pay | Admitting: *Deleted

## 2017-02-22 NOTE — Telephone Encounter (Signed)
Called patient and left message regarding phone call received at office.  [facility] has been contacting patient to set up delivery of Auvi-Q and has not received response from patient.  [facility] requested our office contact patient and ask her to call [facility] at her convenience to arrange delivery of Auvi-Q.  Asked patinet to contact office if she had any questions regarding my call, otherwise she just needed to speak with [employer].

## 2017-07-11 ENCOUNTER — Other Ambulatory Visit: Payer: Self-pay | Admitting: Obstetrics and Gynecology

## 2017-07-11 ENCOUNTER — Other Ambulatory Visit (HOSPITAL_COMMUNITY)
Admission: RE | Admit: 2017-07-11 | Discharge: 2017-07-11 | Disposition: A | Discharge status: Home / Self Care | Payer: Managed Care, Other (non HMO) | Source: Ambulatory Visit | Attending: Obstetrics and Gynecology | Admitting: Obstetrics and Gynecology

## 2017-07-11 DIAGNOSIS — Z01411 Encounter for gynecological examination (general) (routine) with abnormal findings: Secondary | ICD-10-CM | POA: Insufficient documentation

## 2017-07-13 LAB — CYTOLOGY - PAP
Diagnosis: NEGATIVE
HPV: NOT DETECTED

## 2018-06-04 ENCOUNTER — Other Ambulatory Visit: Payer: Self-pay | Admitting: Family Medicine

## 2018-06-04 DIAGNOSIS — D1803 Hemangioma of intra-abdominal structures: Secondary | ICD-10-CM

## 2018-06-12 ENCOUNTER — Encounter: Payer: Self-pay | Admitting: Radiology

## 2018-06-12 ENCOUNTER — Ambulatory Visit
Admission: RE | Admit: 2018-06-12 | Discharge: 2018-06-12 | Disposition: A | Discharge status: Home / Self Care | Payer: Managed Care, Other (non HMO) | Source: Ambulatory Visit | Attending: Family Medicine | Admitting: Family Medicine

## 2018-06-12 DIAGNOSIS — D1803 Hemangioma of intra-abdominal structures: Secondary | ICD-10-CM

## 2018-06-12 MED ORDER — GADOBENATE DIMEGLUMINE 529 MG/ML IV SOLN
14.0000 mL | Freq: Once | INTRAVENOUS | Status: AC | PRN
  Administered 2018-06-12: 14 mL via INTRAVENOUS

## 2019-05-20 ENCOUNTER — Ambulatory Visit: Payer: Managed Care, Other (non HMO) | Attending: Internal Medicine

## 2019-05-20 DIAGNOSIS — Z20822 Contact with and (suspected) exposure to covid-19: Secondary | ICD-10-CM

## 2019-05-21 LAB — NOVEL CORONAVIRUS, NAA: SARS-CoV-2, NAA: NOT DETECTED

## 2019-08-13 ENCOUNTER — Other Ambulatory Visit (HOSPITAL_BASED_OUTPATIENT_CLINIC_OR_DEPARTMENT_OTHER): Payer: Self-pay

## 2019-08-13 DIAGNOSIS — G4710 Hypersomnia, unspecified: Secondary | ICD-10-CM

## 2019-08-13 DIAGNOSIS — R0683 Snoring: Secondary | ICD-10-CM

## 2019-08-13 DIAGNOSIS — G4730 Sleep apnea, unspecified: Secondary | ICD-10-CM

## 2019-08-31 ENCOUNTER — Other Ambulatory Visit (HOSPITAL_COMMUNITY): Payer: Managed Care, Other (non HMO)

## 2019-09-02 ENCOUNTER — Encounter (HOSPITAL_BASED_OUTPATIENT_CLINIC_OR_DEPARTMENT_OTHER): Payer: Managed Care, Other (non HMO) | Admitting: Internal Medicine

## 2019-09-03 ENCOUNTER — Other Ambulatory Visit (HOSPITAL_COMMUNITY)
Admission: RE | Admit: 2019-09-03 | Discharge: 2019-09-03 | Disposition: A | Discharge status: Home / Self Care | Payer: Managed Care, Other (non HMO) | Source: Ambulatory Visit | Attending: Internal Medicine | Admitting: Internal Medicine

## 2019-09-03 DIAGNOSIS — Z01812 Encounter for preprocedural laboratory examination: Secondary | ICD-10-CM | POA: Insufficient documentation

## 2019-09-03 DIAGNOSIS — Z20822 Contact with and (suspected) exposure to covid-19: Secondary | ICD-10-CM | POA: Diagnosis not present

## 2019-09-03 LAB — SARS CORONAVIRUS 2 (TAT 6-24 HRS): SARS Coronavirus 2: NEGATIVE

## 2019-09-05 ENCOUNTER — Ambulatory Visit (HOSPITAL_BASED_OUTPATIENT_CLINIC_OR_DEPARTMENT_OTHER): Payer: Managed Care, Other (non HMO) | Attending: Internal Medicine | Admitting: Internal Medicine

## 2019-09-05 ENCOUNTER — Other Ambulatory Visit: Payer: Self-pay

## 2019-09-05 DIAGNOSIS — G4730 Sleep apnea, unspecified: Secondary | ICD-10-CM | POA: Diagnosis present

## 2019-09-05 DIAGNOSIS — R0683 Snoring: Secondary | ICD-10-CM

## 2019-09-05 DIAGNOSIS — G4710 Hypersomnia, unspecified: Secondary | ICD-10-CM | POA: Diagnosis not present

## 2019-09-08 IMAGING — MR MR ABDOMEN WO/W CM
11 of 17 series · 30 of 48 positions shown · IV contrast (multihance)
Comparison: None.

CLINICAL DATA: Right upper quadrant abdominal pain. Possible
hepatic hemangioma.

EXAM:
MRI ABDOMEN WITHOUT AND WITH CONTRAST
TECHNIQUE: Multiplanar multisequence MR imaging of the abdomen was performed
both before and after the administration of intravenous contrast.
CONTRAST:  14mL MULTIHANCE GADOBENATE DIMEGLUMINE 529 MG/ML IV SOLN

[Series 3: cor haste · coronal · 5.0mm · 0.68mm/px · 2 of 35 slices shown]
[im 1/35]
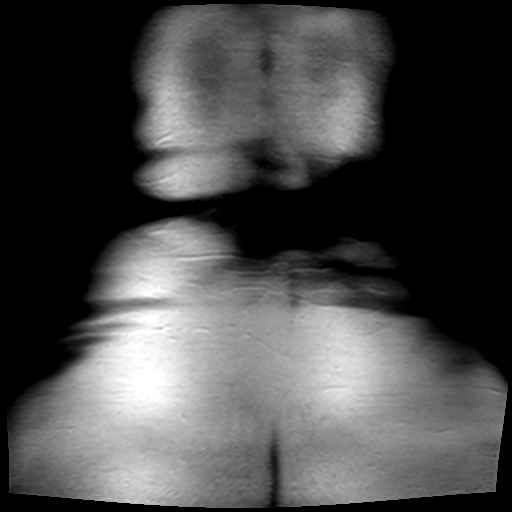
[im 35/35]
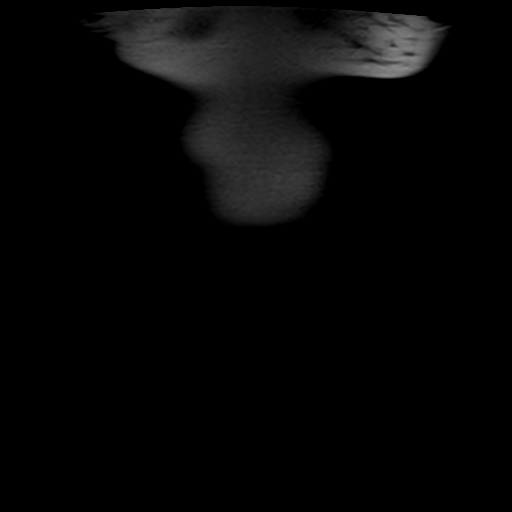

[Series 4: axial haste · axial · 6.0mm · 0.68mm/px · z∈[-150,+88]mm · 2 of 37 slices shown]
[im 1/37]
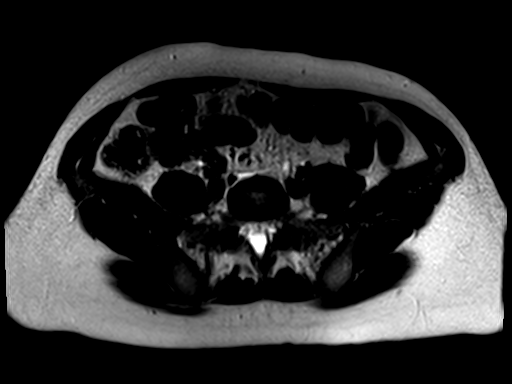
[im 37/37]
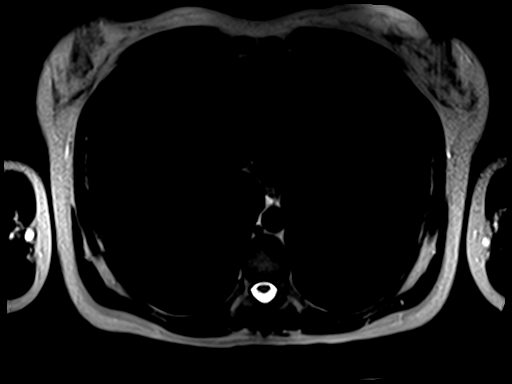

[Series 5: T1 · axial · 6.0mm · 0.68mm/px · z∈[-150,+88]mm · 4 of 74 slices shown]
[im 1/74]
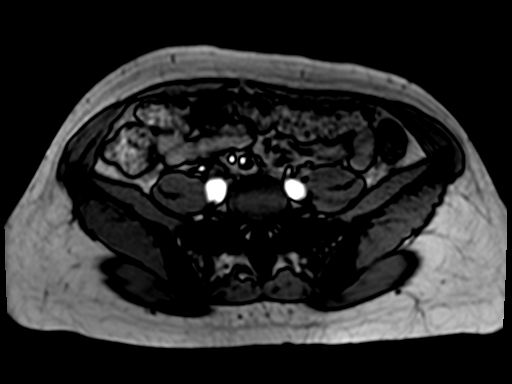
[im 25/74]
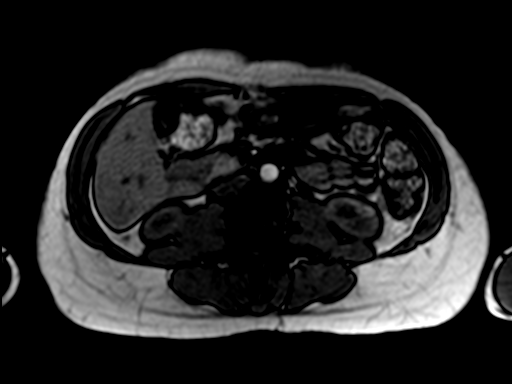
[im 49/74]
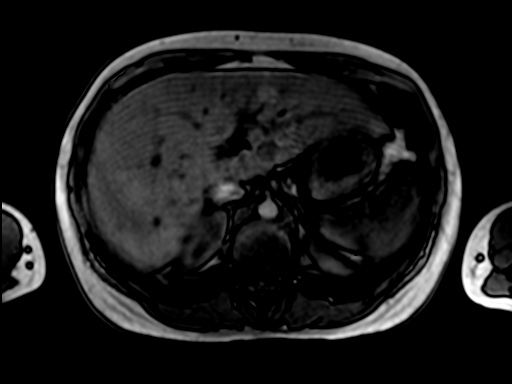
[im 74/74]
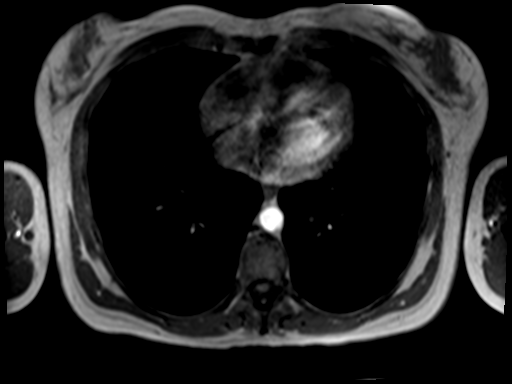

[Series 6: bSSFP · axial · 4.0mm · 0.68mm/px · z∈[-151,+89]mm · 3 of 61 slices shown]
[im 1/61]
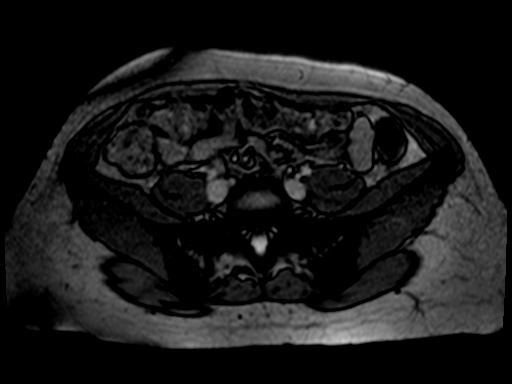
[im 31/61]
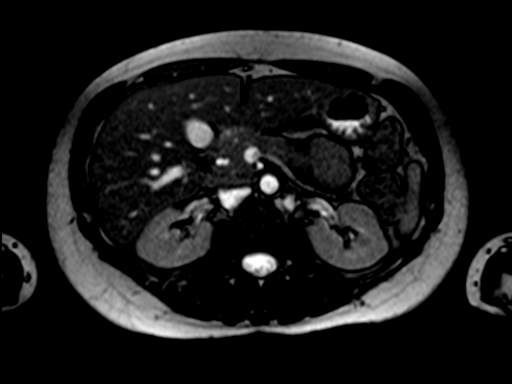
[im 61/61]
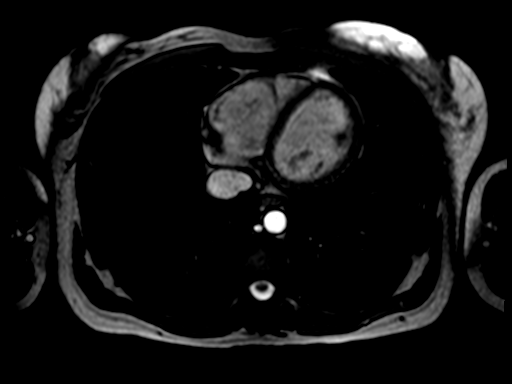

[Series 7: T2 · axial · 6.0mm · 1.09mm/px · z∈[-118,+134]mm · 2 of 36 slices shown]
[im 1/36]
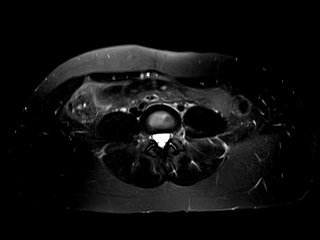
[im 36/36]
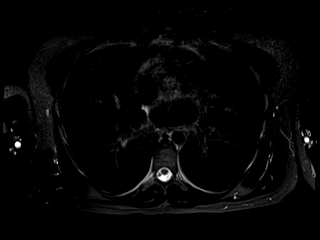

[Series 8: [id number]_500_[id number]_trig · axial · 6.0mm · 1.82mm/px · z∈[-118,+134]mm · 4 of 108 slices shown]
[im 1/108]
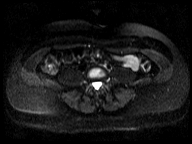
[im 36/108]
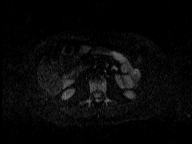
[im 72/108]
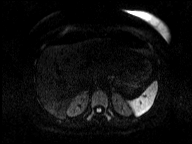
[im 108/108]
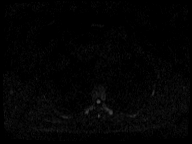

[Series 9: [id number]_trig_adc · axial · 6.0mm · 1.82mm/px · 1 of 36 slices shown]
[im 1/36]
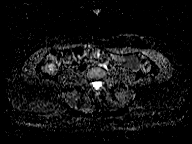

[Series 10: T1 dynamic · axial · non-contrast · 2.5mm · 0.72mm/px · z∈[-140,+78]mm · 3 of 88 slices shown]
[im 1/88]
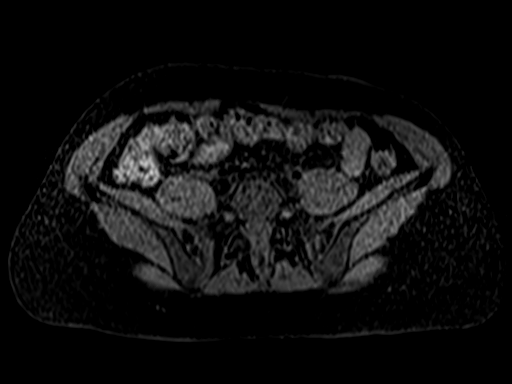
[im 44/88]
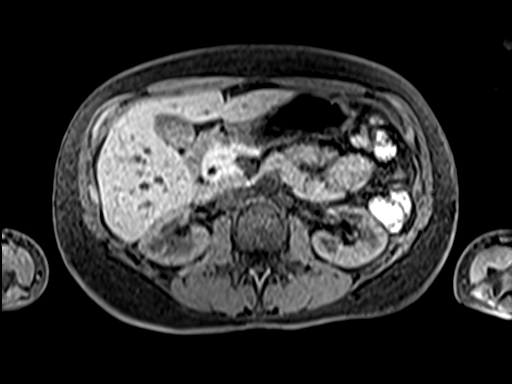
[im 88/88]
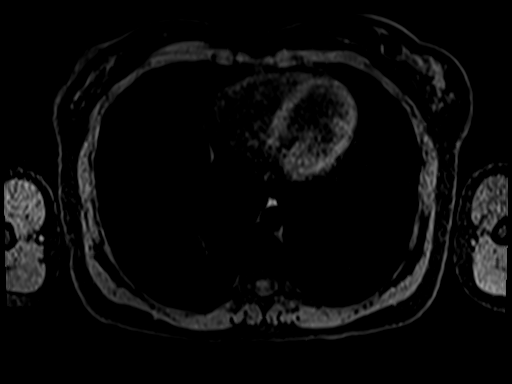

[Series 11: T1 dynamic post-contrast · axial · 2.5mm · 0.72mm/px · z∈[-140,+78]mm · 3 of 88 slices shown (1 of 3)]
[im 1/88]
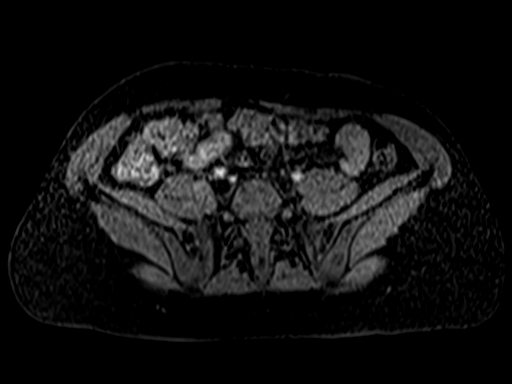
[im 44/88]
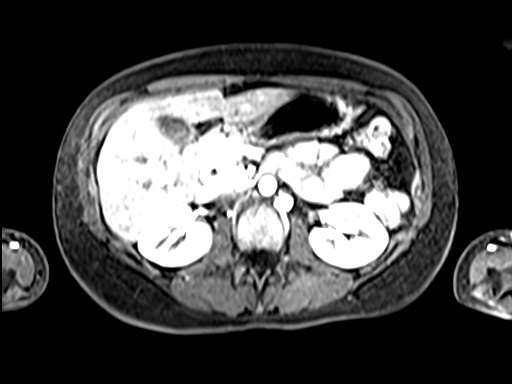
[im 88/88]
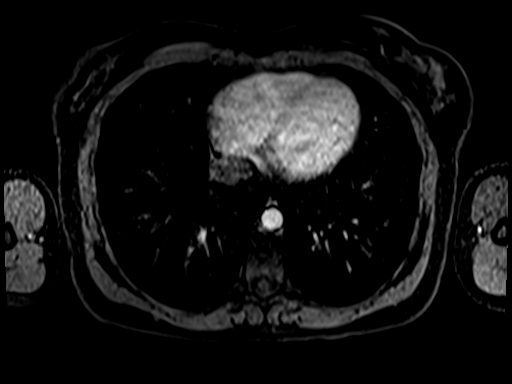

[Series 12: T1 dynamic post-contrast · axial · 2.5mm · 0.72mm/px · z∈[-140,+78]mm · 3 of 88 slices shown (2 of 3)]
[im 1/88]
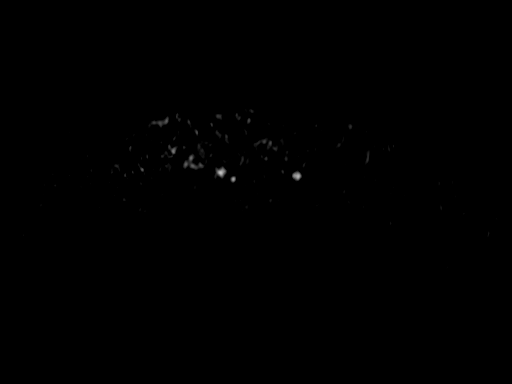
[im 44/88]
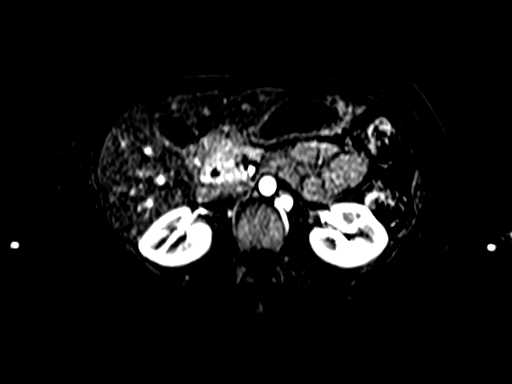
[im 88/88]
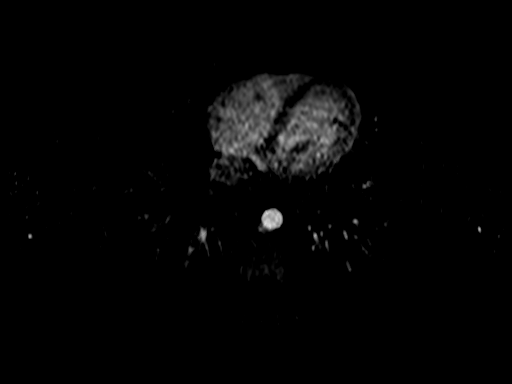

[Series 13: T1 dynamic post-contrast · axial · 2.5mm · 0.72mm/px · z∈[-140,+78]mm · 3 of 88 slices shown (3 of 3)]
[im 1/88]
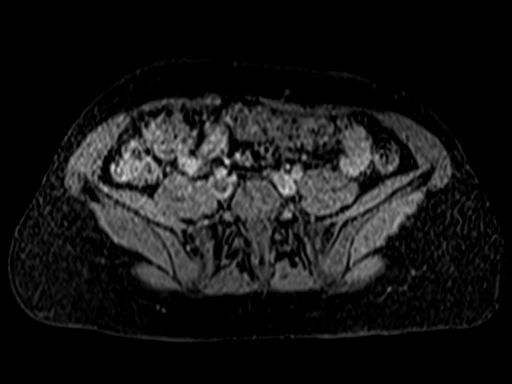
[im 44/88]
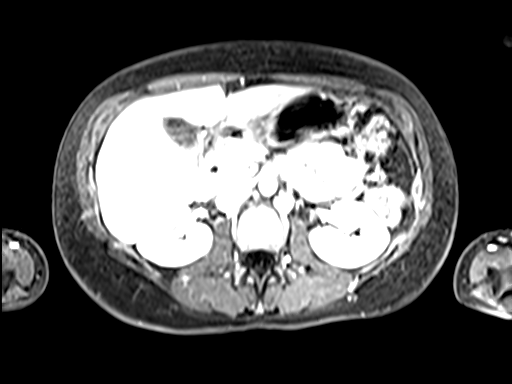
[im 88/88]
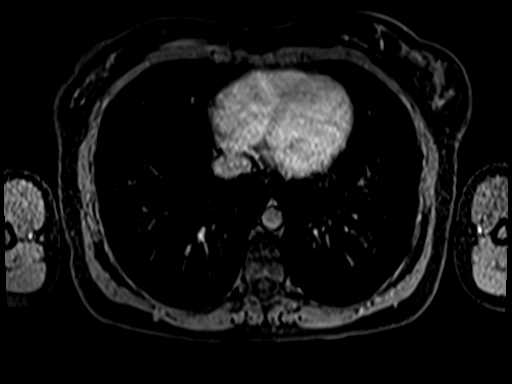

[30 of 48 positions shown; findings below may reference images not displayed]

FINDINGS: Lower chest: Unremarkable

Hepatobiliary: A 1.2 by 0.7 cm T2 hyperintense lesion segment 7 of
the liver on image [DATE] demonstrates slow peripheral centripetal
nodular enhancement favoring hepatic hemangioma.

The remainder of the liver is unremarkable. Gallbladder
unremarkable.

Pancreas: Pancreas divisum noted, without dorsal pancreatic duct
dilatation.

Spleen:  Unremarkable

Adrenals/Urinary Tract:  Unremarkable

Stomach/Bowel: Prominent stool throughout the colon favors
constipation.

Vascular/Lymphatic:  Unremarkable

Other:  No supplemental non-categorized findings.

Musculoskeletal: Degenerative endplate findings at the L2-3 level.
IMPRESSION: 1. 1.2 by 0.7 cm lesion in segment 7 of the liver with imaging
characteristics most compatible with a small hepatic hemangioma.
2. Pancreas divisum.
3. Degenerative endplate findings at L2-3.

## 2019-09-16 NOTE — Procedures (Signed)
   NAME: Kristi Richards DATE OF BIRTH:  1981-02-13 MEDICAL RECORD NUMBER [id number]  LOCATION: [facility]  PHYSICIAN: Marius Ditch  DATE OF STUDY: 09/05/2019  SLEEP STUDY TYPE: Nocturnal Polysomnogram               REFERRING PHYSICIAN: Marius Ditch, MD  INDICATION FOR STUDY: witnessed apnea, snoring, excessive daytime sleepiness, nonrestorative sleep, morning dry mouth and headaches.   EPWORTH SLEEPINESS SCORE:  14 HEIGHT: 5\' 4"  (162.6 cm)  WEIGHT: 156 lb (70.8 kg)    Body mass index is 26.78 kg/m.  NECK SIZE: 15 in.  MEDICATIONS Patient self administered medications include: N/A. Medications administered during study include No sleep medicine administered.Marland Kitchen  SLEEP STUDY TECHNIQUE A multi-channel overnight Polysomnography study was performed. The channels recorded and monitored were central and occipital EEG, electrooculogram (EOG), submentalis EMG (chin), nasal and oral airflow, thoracic and abdominal wall motion, anterior tibialis EMG, snore microphone, electrocardiogram, and a pulse oximetry.  TECHNICAL COMMENTS Comments added by Technician: Patient had difficulty initiating sleep. Patient was restless all through the night. ABNORMAL ECG WAS SEEN THROUGH THE EPOCH, POSSIBLE PACs, PVCs, BRADYCARDIA Comments added by Scorer: N/A  SLEEP ARCHITECTURE The study was initiated at 9:59:45 PM and terminated at 4:49:02 AM. The total recorded time was 409.3 minutes. EEG confirmed total sleep time was 346.9 minutes yielding a sleep efficiency of 84.8%%. Sleep onset after lights out was 23.3 minutes with a REM latency of 67.0 minutes. The patient spent 7.2%% of the night in stage N1 sleep, 73.9%% in stage N2 sleep, 6.2%% in stage N3 and 12.7% in REM. Wake after sleep onset (WASO) was 39.0 minutes. The Arousal Index was 8.6/hour.  RESPIRATORY PARAMETERS There were a total of 0 respiratory disturbances out of which 0 were apneas ( 0 obstructive, 0 mixed, 0 central)  and 0 hypopneas. The apnea/hypopnea index (AHI) was 0.0 events/hour. The central sleep apnea index was 0.0 events/hour. The REM AHI was 0.0 events/hour and NREM AHI was 0.0 events/hour. The supine AHI was 0.0 events/hour and the non supine AHI was 0 events/hour. Respiratory disturbances were associated with oxygen desaturation down to a nadir of 93.0% during sleep. The mean oxygen saturation during the study was 95.6%. The cumulative time under 88% oxygen saturation was 0 minutes.  LEG MOVEMENT DATA The total leg movements were 11 with a resulting leg movement index of 1.9/hr . Associated arousal with leg movement index was 0.0/hr.  CARDIAC DATA The underlying cardiac rhythm was most consistent with sinus rhythm. Mean heart rate during sleep was 50.8 bpm. Additional rhythm abnormalities include Atrial bigeminy, ventricular bigeminy,  and PVCs.  IMPRESSIONS - No significant sleep disordered breathing - Electrocardiographic data showed presence of atrial bigeminy, ventricular bigeminy, and PVCs. Although the tech notes indicated the presence of atrial fibrillation, this was not present at anytime in the night.  - No significant periodic leg movements(PLMs) during sleep.   DIAGNOSIS - Excessive daytime sleepiness  RECOMMENDATIONS - No cause of excessive sleepiness was noted on this study   Marius Ditch Sleep specialist, American Board of Internal Medicine  ELECTRONICALLY SIGNED ON:  09/16/2019, 6:11 AM [place] PH: [phone]   FX: [phone] [place]
